# Patient Record
Sex: Male | Born: 1980 | Race: Black or African American | Hispanic: No | Marital: Single | Smoking: Current every day smoker
Health system: Southern US, Community
[De-identification: ages and names within clinical notes are randomized; demographics above are authoritative.]

## PROBLEM LIST (undated history)

## (undated) DIAGNOSIS — IMO0001 Reserved for inherently not codable concepts without codable children: Secondary | ICD-10-CM

---

## 2005-12-24 ENCOUNTER — Emergency Department (HOSPITAL_COMMUNITY): Admission: EM | Admit: 2005-12-24 | Discharge: 2005-12-24 | Payer: Self-pay | Admitting: Family Medicine

## 2006-02-19 ENCOUNTER — Emergency Department (HOSPITAL_COMMUNITY): Admission: EM | Admit: 2006-02-19 | Discharge: 2006-02-19 | Payer: Self-pay | Admitting: Emergency Medicine

## 2007-02-01 ENCOUNTER — Emergency Department (HOSPITAL_COMMUNITY): Admission: EM | Admit: 2007-02-01 | Discharge: 2007-02-02 | Payer: Self-pay | Admitting: Emergency Medicine

## 2011-05-05 ENCOUNTER — Emergency Department (HOSPITAL_COMMUNITY): Payer: Self-pay

## 2011-05-05 ENCOUNTER — Emergency Department (HOSPITAL_COMMUNITY)
Admission: EM | Admit: 2011-05-05 | Discharge: 2011-05-05 | Disposition: A | Discharge status: Home / Self Care | Payer: Self-pay | Attending: Emergency Medicine | Admitting: Emergency Medicine

## 2011-05-05 ENCOUNTER — Encounter (HOSPITAL_COMMUNITY): Payer: Self-pay | Admitting: Emergency Medicine

## 2011-05-05 DIAGNOSIS — M79609 Pain in unspecified limb: Secondary | ICD-10-CM | POA: Insufficient documentation

## 2011-05-05 DIAGNOSIS — W268XXA Contact with other sharp object(s), not elsewhere classified, initial encounter: Secondary | ICD-10-CM | POA: Insufficient documentation

## 2011-05-05 DIAGNOSIS — F17200 Nicotine dependence, unspecified, uncomplicated: Secondary | ICD-10-CM | POA: Insufficient documentation

## 2011-05-05 DIAGNOSIS — S62309A Unspecified fracture of unspecified metacarpal bone, initial encounter for closed fracture: Secondary | ICD-10-CM

## 2011-05-05 DIAGNOSIS — IMO0001 Reserved for inherently not codable concepts without codable children: Secondary | ICD-10-CM | POA: Insufficient documentation

## 2011-05-05 DIAGNOSIS — S62339A Displaced fracture of neck of unspecified metacarpal bone, initial encounter for closed fracture: Secondary | ICD-10-CM | POA: Insufficient documentation

## 2011-05-05 HISTORY — DX: Reserved for inherently not codable concepts without codable children: IMO0001

## 2011-05-05 MED ORDER — OXYCODONE-ACETAMINOPHEN 5-325 MG PO TABS
1.0000 | ORAL_TABLET | Freq: Once | ORAL | Status: AC
  Administered 2011-05-05: 1 via ORAL
  Filled 2011-05-05: qty 1

## 2011-05-05 MED ORDER — OXYCODONE-ACETAMINOPHEN 5-325 MG PO TABS: 1.0000 | ORAL_TABLET | ORAL | Status: AC | PRN

## 2011-05-05 NOTE — Progress Notes (Signed)
Orthopedic Tech Progress Note Patient Details:  George Dickerson 09/07/1980 [id number]  Type of Splint: Other (comment) (ulnae gutter) Splint Location: right hand Splint Interventions: Application    Nikki Dom 05/05/2011, 7:52 PM
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
## 2011-05-05 NOTE — Discharge Instructions (Signed)
Hand Fracture, Fifth Metacarpal  The small metacarpal is the bone at the base of the little finger between the knuckle and the wrist. A fracture is a break in that bone. One of the fractures that is common to this bone is called a Boxer's Fracture.  TREATMENT  These fractures can be treated with:    Reduction (bones moved back into place), then pinned through the skin to maintain the position, and then casted for about 6 weeks or as your caregiver determines necessary.   ORIF (open reduction and internal fixation) - the fracture site is opened and the bone pieces are fixed into place with pins and then casted for approximately 6 weeks or as your caregiver determines necessary.  Your caregiver will discuss the type of fracture you have and the treatment that should be best for that problem. If surgery is the treatment of choice, the following is information for you to know, and also let your caregiver know about prior to surgery.   LET YOUR CAREGIVER KNOW ABOUT:   Allergies.   Medications taken including herbs, eye drops, over the counter medications, and creams.   Use of steroids (by mouth or creams).   Previous problems with anesthetics or novocaine.   Possibility of pregnancy, if this applies.   History of blood clots (thrombophlebitis).   History of bleeding or blood problems.   Previous surgery.   Other health problems.  AFTER THE PROCEDURE  After surgery, you will be taken to the recovery area where a nurse will watch and check your progress. Once you're awake, stable, and taking fluids well, barring other problems you'll be allowed to go home. Once home an ice pack applied to your operative site may help with discomfort and keep the swelling down.  HOME CARE INSTRUCTIONS    Follow your caregiver's instructions as to activities, exercises, physical therapy, and driving a car.   Daily exercise is helpful for maintaining range of motion (movement and mobility) and strength. Exercise as  instructed.   To lessen swelling, keep the injured hand elevated above the level of your heart as much as possible.   Apply ice to the injury for 15 to 20 minutes each hour while awake for the first 2 days. Put the ice in a plastic bag and place a thin towel between the bag of ice and your cast.   Move the fingers of your casted hand at least several times a day.   If a plaster or fiberglass cast was applied:   Do not try to scratch the skin under the cast using a sharp or pointed object.   Check the skin around the cast every day. You may put lotion on red or sore areas.   Keep your cast dry. Your cast can be protected during bathing with a plastic bag. Do not put your cast into the water.   If a plaster splint was applied:   Wear the splint for as long as directed by your caregiver or until seen for follow-up examination.   Do not get your splint wet. Protect it during bathing with a plastic bag.   You may loosen the elastic bandage around the splint if your fingers start to get numb, tingle, get cold or turn blue.   Do not put pressure on your cast or splint; this may cause it to break. Especially, do not lean plaster casts on hard surfaces for 24 hours after application.   Take medications as directed by your caregiver.     discomfort, or fever as directed by your caregiver.   Follow all instructions for physician referrals, physical therapy, and rehabilitation. Any delay in obtaining necessary care could result in permanent injury, disability and chronic pain.  SEEK MEDICAL CARE IF:   Increased bleeding (more than a small spot) from the wound or from beneath your cast or splint if there is a wound beneath the cast from surgery.   Redness, swelling, or increasing pain in the wound or from beneath your cast or splint.   Pus coming from wound or from beneath your cast or splint.   An unexplained oral temperature  above 102 F (38.9 C) develops.   A foul smell coming from the wound or dressing or from beneath your cast or splint.   You are unable to move your little finger.  SEEK IMMEDIATE MEDICAL CARE IF:  You develop a rash, have difficulty breathing, or have any allergy problems. If you do not have a window in your cast for observing the wound, a discharge or minor bleeding may show up as a stain on the outside of your cast. Report these findings to your caregiver. MAKE SURE YOU:   Understand these instructions.   Will watch your condition.   Will get help right away if you are not doing well or get worse.  Document Released: 05/24/2000 Document Revised: 02/04/2011 Document Reviewed: 10/05/2007 [facility] Patient Information 2012 [place], [place].  Cast or Splint Care Casts and splints support injured limbs and keep bones from moving while they heal.  HOME CARE  Keep the cast or splint uncovered during the drying period.   A plaster cast can take 24 to 48 hours to dry.   A fiberglass cast will dry in less than 1 hour.   Do not rest the cast on anything harder than a pillow for 24 hours.   Do not put weight on your injured limb. Do not put pressure on the cast. Wait for your doctor's approval.   Keep the cast or splint dry.   Cover the cast or splint with a plastic bag during baths or wet weather.   If you have a cast over your chest and belly (trunk), take sponge baths until the cast is taken off.   Keep your cast or splint clean. Wash a dirty cast with a damp cloth.   Do not put any objects under your cast or splint. Do not scratch the skin under the cast with an object.   Do not take out the padding from inside your cast.   Exercise your joints near the cast as told by your doctor.   Raise (elevate) your injured limb on 1 or 2 pillows for the first 1 to 3 days.  GET HELP RIGHT AWAY IF:  Your cast or splint cracks.   Your cast or splint is too tight or too loose.   You  itch badly under the cast.   Your cast gets wet or has a soft spot.   You have a bad smell coming from the cast.   You get an object stuck under the cast.   Your skin around the cast becomes red or raw.   You have new or more pain after the cast is put on.   You have fluid leaking through the cast.   You cannot move your fingers or toes.   Your fingers or toes turn colors or are cool, painful, or puffy (swollen).   You have tingling or lose feeling (numbness) around  the injured area.   You have pain or pressure under the cast.   You have trouble breathing or have shortness of breath.   You have chest pain.  MAKE SURE YOU:  Understand these instructions.   Will watch your condition.   Will get help right away if you are not doing well or get worse.  Document Released: 06/17/2010 Document Revised: 02/04/2011 Document Reviewed: 06/17/2010 [facility] Patient Information 2012 [place], [place].
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
## 2013-05-29 ENCOUNTER — Encounter (HOSPITAL_COMMUNITY): Payer: Self-pay | Admitting: Emergency Medicine

## 2013-05-29 ENCOUNTER — Emergency Department (HOSPITAL_COMMUNITY): Payer: BC Managed Care – PPO

## 2013-05-29 ENCOUNTER — Emergency Department (HOSPITAL_COMMUNITY)
Admission: EM | Admit: 2013-05-29 | Discharge: 2013-05-30 | Disposition: A | Discharge status: Home / Self Care | Payer: BC Managed Care – PPO | Attending: Emergency Medicine | Admitting: Emergency Medicine

## 2013-05-29 DIAGNOSIS — R0789 Other chest pain: Secondary | ICD-10-CM | POA: Insufficient documentation

## 2013-05-29 DIAGNOSIS — R9431 Abnormal electrocardiogram [ECG] [EKG]: Secondary | ICD-10-CM | POA: Insufficient documentation

## 2013-05-29 DIAGNOSIS — F17200 Nicotine dependence, unspecified, uncomplicated: Secondary | ICD-10-CM | POA: Insufficient documentation

## 2013-05-29 LAB — CBC WITH DIFFERENTIAL/PLATELET
Basophils Absolute: 0 10*3/uL (ref 0.0–0.1)
Basophils Relative: 0 % (ref 0–1)
Eosinophils Absolute: 0.1 10*3/uL (ref 0.0–0.7)
Eosinophils Relative: 1 % (ref 0–5)
HCT: 48.1 % (ref 39.0–52.0)
Hemoglobin: 17 g/dL (ref 13.0–17.0)
LYMPHS ABS: 2.4 10*3/uL (ref 0.7–4.0)
Lymphocytes Relative: 36 % (ref 12–46)
MCH: 30.3 pg (ref 26.0–34.0)
MCHC: 35.3 g/dL (ref 30.0–36.0)
MCV: 85.7 fL (ref 78.0–100.0)
MONO ABS: 0.7 10*3/uL (ref 0.1–1.0)
Monocytes Relative: 11 % (ref 3–12)
NEUTROS ABS: 3.5 10*3/uL (ref 1.7–7.7)
Neutrophils Relative %: 52 % (ref 43–77)
Platelets: 265 10*3/uL (ref 150–400)
RBC: 5.61 MIL/uL (ref 4.22–5.81)
RDW: 13.8 % (ref 11.5–15.5)
WBC: 6.6 10*3/uL (ref 4.0–10.5)

## 2013-05-29 LAB — BASIC METABOLIC PANEL
BUN: 8 mg/dL (ref 6–23)
CO2: 27 meq/L (ref 19–32)
CREATININE: 1.18 mg/dL (ref 0.50–1.35)
Calcium: 9.3 mg/dL (ref 8.4–10.5)
Chloride: 100 mEq/L (ref 96–112)
GFR, EST NON AFRICAN AMERICAN: 81 mL/min — AB (ref >90)
GLUCOSE: 83 mg/dL (ref 70–99)
Potassium: 3.7 mEq/L (ref 3.7–5.3)
SODIUM: 141 meq/L (ref 137–147)

## 2013-05-29 LAB — I-STAT TROPONIN, ED: Troponin i, poc: 0 ng/mL (ref 0.00–0.08)

## 2013-05-29 MED ORDER — HYDROCODONE-ACETAMINOPHEN 5-325 MG PO TABS
1.0000 | ORAL_TABLET | Freq: Once | ORAL | Status: DC
  Filled 2013-05-29: qty 1

## 2013-05-29 MED ORDER — KETOROLAC TROMETHAMINE 60 MG/2ML IM SOLN
60.0000 mg | Freq: Once | INTRAMUSCULAR | Status: AC
  Administered 2013-05-30: 60 mg via INTRAMUSCULAR
  Filled 2013-05-29: qty 2

## 2013-05-29 NOTE — ED Provider Notes (Signed)
CSN: [id number]     Arrival date & time 05/29/13  1944 History   First MD Initiated Contact with Patient 05/29/13 2255     Chief Complaint  Patient presents with  . Chest Pain     (Consider location/radiation/quality/duration/timing/severity/associated sxs/prior Treatment) HPI Comments: 33 year old male with no medical history, no surgery history, no cardiac history, every day smoker, occasional alcohol present with chest ache since Saturday morning. Patient has constant pain sharp at times. No history of similar. Patient does lift heavy boxes regularly at work. Pain is worse with movement. No cardiac risks except for smoking.Patient denies blood clot history, active cancer, recent major trauma or surgery, unilateral leg swelling/ pain, recent long travel, hemoptysis. No exertional or diaphragmatic component. A history of stress test or family history of cardiac.   Patient is a 33 y.o. male presenting with chest pain. The history is provided by the patient.  Chest Pain Associated symptoms: no abdominal pain, no back pain, no cough, no fever, no headache, no shortness of breath and not vomiting     Past Medical History  Diagnosis Date  . No significant past medical history    History reviewed. No pertinent past surgical history. History reviewed. No pertinent family history. History  Substance Use Topics  . Smoking status: Current Every Day Smoker -- 1.00 packs/day    Types: Cigarettes  . Smokeless tobacco: Not on file  . Alcohol Use: Yes     Comment: social    Review of Systems  Constitutional: Negative for fever and chills.  HENT: Negative for congestion.   Eyes: Negative for visual disturbance.  Respiratory: Negative for cough and shortness of breath.   Cardiovascular: Positive for chest pain.  Gastrointestinal: Negative for vomiting and abdominal pain.  Genitourinary: Negative for dysuria and flank pain.  Musculoskeletal: Negative for back pain, neck pain and neck  stiffness.  Skin: Negative for rash.  Neurological: Negative for light-headedness and headaches.      Allergies  Review of patient's allergies indicates no known allergies.  Home Medications  No current outpatient prescriptions on file. BP 139/95  Pulse 80  Temp(Src) 98.8 F (37.1 C) (Oral)  Resp 18  SpO2 99% Physical Exam  Nursing note and vitals reviewed. Constitutional: He is oriented to person, place, and time. He appears well-developed and well-nourished.  HENT:  Head: Normocephalic and atraumatic.  Eyes: Conjunctivae are normal. Right eye exhibits no discharge. Left eye exhibits no discharge.  Neck: Normal range of motion. Neck supple. No tracheal deviation present.  Cardiovascular: Normal rate, regular rhythm and intact distal pulses.   Pulmonary/Chest: Effort normal and breath sounds normal.  Abdominal: Soft. He exhibits no distension. There is no tenderness. There is no guarding.  Musculoskeletal: He exhibits tenderness (mild anterior upper chest). He exhibits no edema.  Neurological: He is alert and oriented to person, place, and time.  Skin: Skin is warm. No rash noted.  Psychiatric: He has a normal mood and affect.    ED Course  Procedures (including critical care time) Labs Review Labs Reviewed  BASIC METABOLIC PANEL - Abnormal; Notable for the following:    GFR calc non Af Amer 81 (*)    All other components within normal limits  CBC WITH DIFFERENTIAL  I-STAT TROPOININ, ED  Rosezena SensorI-STAT TROPOININ, ED   Imaging Review Dg Chest 2 View  05/29/2013   CLINICAL DATA:  Chest pain, nonsmoker  EXAM: CHEST  2 VIEW  COMPARISON:  None.  FINDINGS: The heart size and mediastinal contours are  within normal limits. Both lungs are clear. The visualized skeletal structures are unremarkable.  IMPRESSION: No active cardiopulmonary disease.   Electronically Signed   By: Esperanza Heir M.D.   On: 05/29/2013 20:59     EKG Interpretation   Date/Time:  Tuesday May 29 2013  19:50:39 EDT Ventricular Rate:  98 PR Interval:  146 QRS Duration: 90 QT Interval:  328 QTC Calculation: 418 R Axis:   84 Text Interpretation:  Normal sinus rhythm Inferior lateral T wave  inversions. Confirmed by Jodi Mourning  MD, Keziah Drotar ([id number]) on 05/29/2013 10:58:30  PM      MDM   Final diagnoses:  Atypical chest pain  Abnormal EKG   Patient very low risk cardiac and very low risk pulmonary embolism. History of present illness not consistent with dissection. Pain likely musculoskeletal with age and minimal risk factors. atypical history for chest cardiac chest pain. Patient is negative for [facility] criteria for evaluation of low risk PE. Less than 77 yo, hr <100, O2 sat >94%, no hx of DVT/PE, no recent trauma/ surgery, no hemoptysis, no exogenous estrogen or unilateral leg swelling.  No further work up indicated at this time for PE as pretest probability very low.    Plan for delta troponin and 2 EKGs. EKG reviewed and it is abnormal with inferior and lateral T wave inversion, unfortunately we do not have old EKGs so plan to repeat look for changes. Pain has been constant since Saturday and if both troponins negative unlikely an acute cardiac event.  discussed patient will need close followup with cardiology for stress test and further evaluation of abnormal EKG. Discussed with cardiology on call who personally reviewed ekg and who recommended outpatient fup if troponin negative.  Patient improved on recheck.  Results and differential diagnosis were discussed with the patient. Close follow up outpatient was discussed, patient comfortable with the plan.   Filed Vitals:   05/30/13 0015 05/30/13 0030 05/30/13 0100 05/30/13 0130  BP: 125/83 118/71 130/79 125/80  Pulse:  63 72 65  Temp:      TempSrc:      Resp: 18 14 19 17   SpO2: 99% 97% 97% 97%        Enid Skeens, MD 06/01/13 2054

## 2013-05-29 NOTE — ED Notes (Signed)
Pt presents to department for evaluation of R sided non radiating chest pain. Onset Saturday. 7/10 pain upon arrival, becomes worse with movement. Pt is alert and oriented x4. Skin warm and dry. No signs of distress noted.

## 2013-05-30 ENCOUNTER — Other Ambulatory Visit: Payer: Self-pay

## 2013-05-30 LAB — I-STAT TROPONIN, ED: Troponin i, poc: 0.01 ng/mL (ref 0.00–0.08)

## 2013-05-30 MED ORDER — ASPIRIN 81 MG PO CHEW
324.0000 mg | CHEWABLE_TABLET | Freq: Once | ORAL | Status: AC
  Administered 2013-05-30: 324 mg via ORAL
  Filled 2013-05-30: qty 4

## 2013-05-30 NOTE — Discharge Instructions (Signed)
Take aspirin or ibuprofen for pain. Follow up for blood pressure monitoring and possible echo of your heart. If you were given medicines take as directed.  If you are on coumadin or contraceptives realize their levels and effectiveness is altered by many different medicines.  If you have any reaction (rash, tongues swelling, other) to the medicines stop taking and see a physician.   Please follow up as directed and return to the ER or see a physician for new or worsening symptoms.  Thank you. Filed Vitals:   05/29/13 1953 05/29/13 2258 05/29/13 2307 05/30/13 0015  BP: 145/95 139/95 139/95 125/83  Pulse: 97 80    Temp: 98.8 F (37.1 C)     TempSrc: Oral     Resp: 20  18 18   SpO2: 100%  99% 99%    Chest Pain (Nonspecific) It is often hard to give a specific diagnosis for the cause of chest pain. There is always a chance that your pain could be related to something serious, such as a heart attack or a blood clot in the lungs. You need to follow up with your caregiver for further evaluation. CAUSES   Heartburn.  Pneumonia or bronchitis.  Anxiety or stress.  Inflammation around your heart (pericarditis) or lung (pleuritis or pleurisy).  A blood clot in the lung.  A collapsed lung (pneumothorax). It can develop suddenly on its own (spontaneous pneumothorax) or from injury (trauma) to the chest.  Shingles infection (herpes zoster virus). The chest wall is composed of bones, muscles, and cartilage. Any of these can be the source of the pain.  The bones can be bruised by injury.  The muscles or cartilage can be strained by coughing or overwork.  The cartilage can be affected by inflammation and become sore (costochondritis). DIAGNOSIS  Lab tests or other studies, such as X-rays, electrocardiography, stress testing, or cardiac imaging, may be needed to find the cause of your pain.  TREATMENT   Treatment depends on what may be causing your chest pain. Treatment may include:  Acid  blockers for heartburn.  Anti-inflammatory medicine.  Pain medicine for inflammatory conditions.  Antibiotics if an infection is present.  You may be advised to change lifestyle habits. This includes stopping smoking and avoiding alcohol, caffeine, and chocolate.  You may be advised to keep your head raised (elevated) when sleeping. This reduces the chance of acid going backward from your stomach into your esophagus.  Most of the time, nonspecific chest pain will improve within 2 to 3 days with rest and mild pain medicine. HOME CARE INSTRUCTIONS   If antibiotics were prescribed, take your antibiotics as directed. Finish them even if you start to feel better.  For the next few days, avoid physical activities that bring on chest pain. Continue physical activities as directed.  Do not smoke.  Avoid drinking alcohol.  Only take over-the-counter or prescription medicine for pain, discomfort, or fever as directed by your caregiver.  Follow your caregiver's suggestions for further testing if your chest pain does not go away.  Keep any follow-up appointments you made. If you do not go to an appointment, you could develop lasting (chronic) problems with pain. If there is any problem keeping an appointment, you must call to reschedule. SEEK MEDICAL CARE IF:   You think you are having problems from the medicine you are taking. Read your medicine instructions carefully.  Your chest pain does not go away, even after treatment.  You develop a rash with blisters on your  chest. SEEK IMMEDIATE MEDICAL CARE IF:   You have increased chest pain or pain that spreads to your arm, neck, jaw, back, or abdomen.  You develop shortness of breath, an increasing cough, or you are coughing up blood.  You have severe back or abdominal pain, feel nauseous, or vomit.  You develop severe weakness, fainting, or chills.  You have a fever. THIS IS AN EMERGENCY. Do not wait to see if the pain will go away.  Get medical help at once. Call your local emergency services (911 in [place] Do not drive yourself to the hospital. MAKE SURE YOU:   Understand these instructions.  Will watch your condition.  Will get help right away if you are not doing well or get worse. Document Released: 11/25/2004 Document Revised: 05/10/2011 Document Reviewed: 09/21/2007 [facility] Patient Information 2014 [place], [place].

## 2019-02-06 ENCOUNTER — Encounter (HOSPITAL_COMMUNITY): Payer: Self-pay | Admitting: Emergency Medicine

## 2019-02-06 ENCOUNTER — Emergency Department (HOSPITAL_COMMUNITY)
Admission: EM | Admit: 2019-02-06 | Discharge: 2019-02-06 | Disposition: A | Discharge status: Home / Self Care | Payer: BC Managed Care – PPO | Attending: Emergency Medicine | Admitting: Emergency Medicine

## 2019-02-06 ENCOUNTER — Other Ambulatory Visit: Payer: Self-pay

## 2019-02-06 ENCOUNTER — Emergency Department (HOSPITAL_COMMUNITY): Payer: BC Managed Care – PPO

## 2019-02-06 DIAGNOSIS — R0789 Other chest pain: Secondary | ICD-10-CM | POA: Insufficient documentation

## 2019-02-06 DIAGNOSIS — I10 Essential (primary) hypertension: Secondary | ICD-10-CM | POA: Diagnosis not present

## 2019-02-06 DIAGNOSIS — F17210 Nicotine dependence, cigarettes, uncomplicated: Secondary | ICD-10-CM | POA: Insufficient documentation

## 2019-02-06 DIAGNOSIS — M25511 Pain in right shoulder: Secondary | ICD-10-CM | POA: Insufficient documentation

## 2019-02-06 LAB — BASIC METABOLIC PANEL
Anion gap: 9 (ref 5–15)
BUN: 10 mg/dL (ref 6–20)
CO2: 26 mmol/L (ref 22–32)
Calcium: 8.7 mg/dL — ABNORMAL LOW (ref 8.9–10.3)
Chloride: 103 mmol/L (ref 98–111)
Creatinine, Ser: 1.17 mg/dL (ref 0.61–1.24)
GFR calc Af Amer: >60 mL/min (ref >60)
GFR calc non Af Amer: >60 mL/min (ref >60)
Glucose, Bld: 97 mg/dL (ref 70–99)
Potassium: 3.8 mmol/L (ref 3.5–5.1)
Sodium: 138 mmol/L (ref 135–145)

## 2019-02-06 LAB — CBC
HCT: 48.5 % (ref 39.0–52.0)
Hemoglobin: 16.4 g/dL (ref 13.0–17.0)
MCH: 30.8 pg (ref 26.0–34.0)
MCHC: 33.8 g/dL (ref 30.0–36.0)
MCV: 91.2 fL (ref 80.0–100.0)
Platelets: 298 10*3/uL (ref 150–400)
RBC: 5.32 MIL/uL (ref 4.22–5.81)
RDW: 13.3 % (ref 11.5–15.5)
WBC: 7 10*3/uL (ref 4.0–10.5)
nRBC: 0 % (ref 0.0–0.2)

## 2019-02-06 LAB — TROPONIN I (HIGH SENSITIVITY)
Troponin I (High Sensitivity): 3 ng/L (ref <18)
Troponin I (High Sensitivity): <2 ng/L (ref <18)

## 2019-02-06 MED ORDER — IBUPROFEN 400 MG PO TABS
600.0000 mg | ORAL_TABLET | Freq: Once | ORAL | Status: AC
  Administered 2019-02-06: 600 mg via ORAL
  Filled 2019-02-06: qty 1

## 2019-02-06 NOTE — ED Provider Notes (Signed)
[facility] EMERGENCY DEPARTMENT Provider Note   CSN: [id number] Arrival date & time: 02/06/19  1751     History   Chief Complaint Chief Complaint  Patient presents with  . Chest Pain  . Back Pain    HPI George Dickerson is a 38 y.o. male.     Patient with 2 days of chest pain.  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  The history is provided by the patient.  Chest Pain Pain location:  R chest Pain quality: aching and radiating   Pain radiates to:  R shoulder Pain severity:  Mild Onset quality:  Gradual Duration:  2 days Timing:  Intermittent Chronicity:  New Context: lifting, movement and raising an arm   Context: not breathing, not drug use, not eating, not intercourse, not at rest, not stress and not trauma   Relieved by:  Rest Worsened by:  Movement Associated symptoms: back pain   Associated symptoms: no abdominal pain, no altered mental status, no anxiety, no claudication, no cough, no diaphoresis, no dizziness, no dysphagia, no heartburn, no lower extremity edema, no numbness, no PND, no shortness of breath, no vomiting and no weakness   Risk factors: hypertension, male sex and smoking   Back Pain Associated symptoms: chest pain   Associated symptoms: no abdominal pain, no numbness and no weakness     Past Medical History:  Diagnosis Date  . No significant past medical history     Patient Active Problem List   Diagnosis Date Noted  . No significant past medical history     History reviewed. No pertinent surgical history.      Home Medications    Prior to Admission medications   Medication Sig Start Date End Date Taking? Authorizing Provider  ibuprofen (ADVIL) 600 MG tablet Take 600 mg by mouth every 6 (six) hours as needed for mild pain or moderate pain.   Yes [provider]    Family History No family history on file.  Social  History Social History   Tobacco Use  . Smoking status: Current Every Day Smoker    Packs/day: 1.00    Types: Cigarettes  Substance Use Topics  . Alcohol use: Yes    Comment: social  . Drug use: No     Allergies   Patient has no known allergies.   Review of Systems Review of Systems  Constitutional: Negative for diaphoresis.  HENT: Negative for trouble swallowing.   Respiratory: Negative for cough and shortness of breath.   Cardiovascular: Positive for chest pain. Negative for claudication and PND.  Gastrointestinal: Negative for abdominal pain, heartburn and vomiting.  Musculoskeletal: Positive for back pain.  Neurological: Negative for dizziness, weakness and numbness.     Physical Exam Updated Vital Signs BP (!) 145/105   Pulse 66   Temp 98.7 F (37.1 C) (Oral)   Resp 16   Ht 5\' 8"  (1.727 m)   Wt 81.6 kg   SpO2 100%   BMI 27.37 kg/m   Physical Exam Vitals signs reviewed.  Constitutional:      General: He is not in acute distress. HENT:     Head: Normocephalic and atraumatic.  Eyes:     Extraocular Movements: Extraocular movements intact.     Pupils: Pupils are equal, round, and reactive to light.  Neck:     Musculoskeletal: Normal range of motion and neck  supple.     Vascular: No JVD.  Cardiovascular:     Rate and Rhythm: Normal rate and regular rhythm.     Heart sounds: Normal heart sounds.  Pulmonary:     Effort: Pulmonary effort is normal. No tachypnea.     Breath sounds: Normal breath sounds.  Chest:     Chest wall: Tenderness present.  Abdominal:     Palpations: Abdomen is soft.  Musculoskeletal:     Right lower leg: He exhibits no tenderness. No edema.     Left lower leg: He exhibits no tenderness. No edema.     Comments: Patient has pain when moving shoulder up and down  Lymphadenopathy:     Cervical: No cervical adenopathy.  Skin:    General: Skin is warm and dry.  Neurological:     General: No focal deficit present.     Mental  Status: He is alert and oriented to person, place, and time.  Psychiatric:        Mood and Affect: Mood normal.        Behavior: Behavior normal.      ED Treatments / Results  Labs (all labs ordered are listed, but only abnormal results are displayed) Labs Reviewed  BASIC METABOLIC PANEL - Abnormal; Notable for the following components:      Result Value   Calcium 8.7 (*)    All other components within normal limits  CBC  TROPONIN I (HIGH SENSITIVITY)  TROPONIN I (HIGH SENSITIVITY)    EKG EKG Interpretation  Date/Time:  Tuesday February 06 2019 17:58:16 EST Ventricular Rate:  73 PR Interval:  158 QRS Duration: 88 QT Interval:  374 QTC Calculation: 412 R Axis:   81 Text Interpretation: Normal sinus rhythm Normal ECG No significant change since last tracing Confirmed by Isla Pence [phone]) on 02/06/2019 8:57:34 PM   Radiology Dg Chest 2 View  Result Date: 02/06/2019 CLINICAL DATA:  Chest pain, shortness of breath. EXAM: CHEST - 2 VIEW COMPARISON:  May 29, 2013. FINDINGS: The heart size and mediastinal contours are within normal limits. Both lungs are clear. No pneumothorax or pleural effusion is noted. The visualized skeletal structures are unremarkable. IMPRESSION: No active cardiopulmonary disease. Electronically Signed   By: Marijo Conception M.D.   On: 02/06/2019 18:29    Procedures Procedures (including critical care time)  Medications Ordered in ED Medications  ibuprofen (ADVIL) tablet 600 mg (600 mg Oral Given 02/06/19 2116)     Initial Impression / Assessment and Plan / ED Course  I have reviewed the triage vital signs and the nursing notes.  Pertinent labs & imaging results that were available during my care of the patient were reviewed by me and considered in my medical decision making (see chart for details).        Patient presenting with 2 days of right-sided chest pain that radiates to the shoulder.  Most likely MSK given overuse from his job.   Work-up thus far has been relatively negative with EKG interest, negative troponin x1 chest x-ray negative, BMP and CBC unremarkable.    Patient presents with hypertension. Recommend follow up with PCP for further management.   Final Clinical Impressions(s) / ED Diagnoses   Final diagnoses:  Chest wall pain  Hypertension, unspecified type    ED Discharge Orders    None       Bonnita Hollow, MD 02/06/19 2409    Isla Pence, MD 02/06/19 2225

## 2019-02-06 NOTE — ED Triage Notes (Signed)
Pt reports right sided chest pain and back/shoulderblade pain since yesterday. Endorses some intermittent SOB.

## 2019-02-06 NOTE — Discharge Instructions (Addendum)
Please follow up with your regular doctor for hypertension (aka High Blood pressure). Please come back to the ED if you develop any worsening chest pain that does not improve with medication, nausea, abdominal pain, Shortness of Breath or other worrisome symptom.

## 2019-05-29 ENCOUNTER — Ambulatory Visit (HOSPITAL_COMMUNITY)
Admission: EM | Admit: 2019-05-29 | Discharge: 2019-05-29 | Disposition: A | Discharge status: Home / Self Care | Payer: BC Managed Care – PPO | Attending: Family | Admitting: Family

## 2019-05-29 ENCOUNTER — Ambulatory Visit (INDEPENDENT_AMBULATORY_CARE_PROVIDER_SITE_OTHER): Payer: BC Managed Care – PPO

## 2019-05-29 ENCOUNTER — Encounter (HOSPITAL_COMMUNITY): Payer: Self-pay | Admitting: Emergency Medicine

## 2019-05-29 ENCOUNTER — Other Ambulatory Visit: Payer: Self-pay

## 2019-05-29 DIAGNOSIS — R079 Chest pain, unspecified: Secondary | ICD-10-CM | POA: Diagnosis not present

## 2019-05-29 DIAGNOSIS — Y92009 Unspecified place in unspecified non-institutional (private) residence as the place of occurrence of the external cause: Secondary | ICD-10-CM

## 2019-05-29 DIAGNOSIS — R0781 Pleurodynia: Secondary | ICD-10-CM | POA: Diagnosis not present

## 2019-05-29 DIAGNOSIS — R030 Elevated blood-pressure reading, without diagnosis of hypertension: Secondary | ICD-10-CM

## 2019-05-29 DIAGNOSIS — W19XXXA Unspecified fall, initial encounter: Secondary | ICD-10-CM | POA: Diagnosis not present

## 2019-05-29 MED ORDER — NAPROXEN 500 MG PO TABS: 500.0000 mg | ORAL_TABLET | Freq: Two times a day (BID) | ORAL | 0 refills | Status: DC | PRN

## 2019-05-29 NOTE — ED Triage Notes (Signed)
Pt here for fall on Saturday night landing on left side of his ribs on steps; bruising noted to area

## 2019-05-29 NOTE — Discharge Instructions (Addendum)
Recommend take Naproxen 500mg  every 12 hours as needed for pain. Apply heat to area for comfort- may alternate with ice as needed. Avoid strenuous lifting. Continue to monitor symptoms. If any increase in pain, difficulty breathing or shortness of breath occur, return for follow-up or go to the ER ASAP.

## 2019-05-29 NOTE — ED Provider Notes (Signed)
[facility]    CSN: [id number] Arrival date & time: 05/29/19  1603      History   Chief Complaint Chief Complaint  Patient presents with  . Fall    HPI George Dickerson is a 39 y.o. male.   39 year old male presents with injury to the left side of his posterior chest. He slipped off steps at home 4 days ago (was cooking and worried about burning food) and fell, landing on his left side of his chest on wooden steps. He felt some pain at the time but it worsened the next day and thought it would improve with time. The pain continues to get worse, especially with deep breathing and certain movements. Concerned over fracture of ribs. Some bruising noted by family members on left posterior area of back. Has applied warm heat with minimal relief. Has not taken any medication yet for pain. No previous history of injury to chest or back. Works for [employer] and does a lot of lifting. No chronic health issues. Takes no daily medication.   The history is provided by the patient.    Past Medical History:  Diagnosis Date  . No significant past medical history     Patient Active Problem List   Diagnosis Date Noted  . No significant past medical history     History reviewed. No pertinent surgical history.     Home Medications    Prior to Admission medications   Medication Sig Start Date End Date Taking? Authorizing Provider  naproxen (NAPROSYN) 500 MG tablet Take 1 tablet (500 mg total) by mouth every 12 (twelve) hours as needed for moderate pain. 05/29/19   Sudie Grumbling, NP    Family History History reviewed. No pertinent family history.  Social History Social History   Tobacco Use  . Smoking status: Current Every Day Smoker    Packs/day: 1.00    Types: Cigarettes  Substance Use Topics  . Alcohol use: Yes    Comment: social  . Drug use: No     Allergies   Patient has no known allergies.   Review of Systems Review of Systems  Constitutional: Negative for  activity change, appetite change, chills, fatigue and fever.  Respiratory: Negative for cough, chest tightness, shortness of breath and wheezing.   Cardiovascular: Positive for chest pain (posterior left side). Negative for palpitations.  Gastrointestinal: Negative for nausea and vomiting.  Musculoskeletal: Positive for back pain (left side thoracic ) and myalgias. Negative for gait problem, neck pain and neck stiffness.  Skin: Positive for color change. Negative for rash and wound.  Allergic/Immunologic: Negative for environmental allergies, food allergies and immunocompromised state.  Neurological: Negative for dizziness, tremors, seizures, syncope, weakness, light-headedness, numbness and headaches.  Hematological: Negative for adenopathy. Does not bruise/bleed easily.     Physical Exam Triage Vital Signs ED Triage Vitals [05/29/19 1642]  Enc Vitals Group     BP (!) 167/95     Pulse Rate 75     Resp 18     Temp 98.4 F (36.9 C)     Temp Source Oral     SpO2 96 %     Weight      Height      Head Circumference      Peak Flow      Pain Score 8     Pain Loc      Pain Edu?      Excl. in GC?    No data found.  Updated Vital Signs BP (!) 167/95 (BP Location: Right Arm)   Pulse 75   Temp 98.4 F (36.9 C) (Oral)   Resp 18   SpO2 96%   Visual Acuity Right Eye Distance:   Left Eye Distance:   Bilateral Distance:    Right Eye Near:   Left Eye Near:    Bilateral Near:     Physical Exam Vitals and nursing note reviewed.  Constitutional:      General: He is awake. He is not in acute distress.    Appearance: He is well-developed and well-groomed. He is not ill-appearing.     Comments: Patient is sitting comfortably on exam table in no acute distress but appears in pain. Most comfortable position is partial flexion of back.   HENT:     Head: Normocephalic and atraumatic.  Eyes:     Extraocular Movements: Extraocular movements intact.     Conjunctiva/sclera: Conjunctivae  normal.  Cardiovascular:     Rate and Rhythm: Normal rate and regular rhythm.     Heart sounds: Normal heart sounds. No murmur.  Pulmonary:     Effort: Pulmonary effort is normal. No accessory muscle usage, respiratory distress or retractions.     Breath sounds: Normal breath sounds and air entry. No stridor or decreased air movement. No decreased breath sounds, wheezing, rhonchi or rales.       Comments: Slight swelling and mild bruising present on left posterior area of lower thoracic region around 9th/10th ribs. Very tender. Has full range of motion of back but increased pain, especially with extension and rotation. No neuro deficits noted.  Chest:     Chest wall: Swelling and tenderness present. No lacerations or deformity.  Musculoskeletal:        General: Tenderness present.     Cervical back: Normal range of motion.  Skin:    General: Skin is warm and dry.     Capillary Refill: Capillary refill takes less than 2 seconds.     Findings: Bruising present. No abrasion, erythema, lesion, rash or wound.     Comments: Slight bruising present left posterior lower thoracic region of back.   Neurological:     General: No focal deficit present.     Mental Status: He is alert and oriented to person, place, and time.     Sensory: Sensation is intact. No sensory deficit.     Motor: Motor function is intact.     Gait: Gait is intact.  Psychiatric:        Mood and Affect: Mood normal.        Behavior: Behavior normal. Behavior is cooperative.        Thought Content: Thought content normal.        Judgment: Judgment normal.      UC Treatments / Results  Labs (all labs ordered are listed, but only abnormal results are displayed) Labs Reviewed - No data to display  EKG   Radiology DG Ribs Unilateral W/Chest Left  Result Date: 05/29/2019 CLINICAL DATA:  Fall with worsening pain EXAM: LEFT RIBS AND CHEST - 3+ VIEW COMPARISON:  02/06/2019 FINDINGS: Single-view chest demonstrates no  focal opacity or pleural effusion. Normal heart size. No pneumothorax. Left rib series demonstrates no acute displaced left rib fracture IMPRESSION: Negative. Electronically Signed   By: Donavan Foil M.D.   On: 05/29/2019 17:34    Procedures Procedures (including critical care time)  Medications Ordered in UC Medications - No data to display  Initial Impression / Assessment  and Plan / UC Course  I have reviewed the triage vital signs and the nursing notes.  Pertinent labs & imaging results that were available during my care of the patient were reviewed by me and considered in my medical decision making (see chart for details).    Reviewed x-ray results with patient- no rib fracture or pneumothorax or pleural effusion. Discussed that he probably bruised his ribs and the ligaments/muscles around his ribs. Recommend trial Naproxen 500mg  twice a day as needed for pain. Apply warm heat to area for comfort- may alternate with ice as needed. Avoid strenuous movements or lifting. Continue to monitor symptoms. Blood pressure may be elevated due to pain- encouraged to continue to monitor. Note written for work. Follow-up here if any increase in pain, difficulty breathing or shortness of breath occur or go to the ER ASAP if symptoms worsen.  Final Clinical Impressions(s) / UC Diagnoses   Final diagnoses:  Rib pain on left side  Fall in home, initial encounter  Elevated blood pressure reading in office without diagnosis of hypertension     Discharge Instructions     Recommend take Naproxen 500mg  every 12 hours as needed for pain. Apply heat to area for comfort- may alternate with ice as needed. Avoid strenuous lifting. Continue to monitor symptoms. If any increase in pain, difficulty breathing or shortness of breath occur, return for follow-up or go to the ER ASAP.     ED Prescriptions    Medication Sig Dispense Auth. Provider   naproxen (NAPROSYN) 500 MG tablet Take 1 tablet (500 mg total) by  mouth every 12 (twelve) hours as needed for moderate pain. 20 tablet Catalyna Reilly, , NP     PDMP not reviewed this encounter.   , NP 05/29/19 2306

## 2020-03-29 ENCOUNTER — Other Ambulatory Visit: Payer: Self-pay

## 2020-03-29 ENCOUNTER — Emergency Department (HOSPITAL_COMMUNITY)
Admission: EM | Admit: 2020-03-29 | Discharge: 2020-03-30 | Disposition: A | Discharge status: Home / Self Care | Payer: BC Managed Care – PPO | Attending: Emergency Medicine | Admitting: Emergency Medicine

## 2020-03-29 DIAGNOSIS — Z5321 Procedure and treatment not carried out due to patient leaving prior to being seen by health care provider: Secondary | ICD-10-CM | POA: Diagnosis not present

## 2020-03-29 DIAGNOSIS — R519 Headache, unspecified: Secondary | ICD-10-CM | POA: Insufficient documentation

## 2020-03-29 MED ORDER — IBUPROFEN 400 MG PO TABS
400.0000 mg | ORAL_TABLET | Freq: Once | ORAL | Status: AC | PRN
  Administered 2020-03-29: 400 mg via ORAL
  Filled 2020-03-29: qty 1

## 2020-03-29 NOTE — ED Triage Notes (Signed)
Pt presents to ED POV. Pt c/o HA that began earlier today. Pt did not take any OTC meds. AAO x4

## 2020-03-30 ENCOUNTER — Emergency Department (HOSPITAL_COMMUNITY): Payer: BC Managed Care – PPO

## 2020-03-30 NOTE — ED Notes (Signed)
Pt called X3 for vitals. Pt removed from waiting room list.

## 2020-03-31 ENCOUNTER — Encounter (HOSPITAL_COMMUNITY): Payer: Self-pay

## 2020-03-31 ENCOUNTER — Ambulatory Visit (HOSPITAL_COMMUNITY)
Admission: EM | Admit: 2020-03-31 | Discharge: 2020-03-31 | Disposition: A | Discharge status: Home / Self Care | Payer: BC Managed Care – PPO | Attending: Urgent Care | Admitting: Urgent Care

## 2020-03-31 ENCOUNTER — Other Ambulatory Visit: Payer: Self-pay

## 2020-03-31 DIAGNOSIS — I10 Essential (primary) hypertension: Secondary | ICD-10-CM | POA: Insufficient documentation

## 2020-03-31 DIAGNOSIS — U071 COVID-19: Secondary | ICD-10-CM | POA: Diagnosis not present

## 2020-03-31 DIAGNOSIS — R52 Pain, unspecified: Secondary | ICD-10-CM | POA: Insufficient documentation

## 2020-03-31 DIAGNOSIS — B349 Viral infection, unspecified: Secondary | ICD-10-CM | POA: Diagnosis not present

## 2020-03-31 DIAGNOSIS — Z20822 Contact with and (suspected) exposure to covid-19: Secondary | ICD-10-CM | POA: Diagnosis present

## 2020-03-31 DIAGNOSIS — R509 Fever, unspecified: Secondary | ICD-10-CM | POA: Diagnosis present

## 2020-03-31 MED ORDER — BENZONATATE 100 MG PO CAPS: 100.0000 mg | ORAL_CAPSULE | Freq: Three times a day (TID) | ORAL | 0 refills | Status: AC | PRN

## 2020-03-31 MED ORDER — CETIRIZINE HCL 10 MG PO TABS: 10.0000 mg | ORAL_TABLET | Freq: Every day | ORAL | 0 refills | Status: AC

## 2020-03-31 MED ORDER — PROMETHAZINE-DM 6.25-15 MG/5ML PO SYRP: 5.0000 mL | ORAL_SOLUTION | Freq: Every evening | ORAL | 0 refills | Status: DC | PRN

## 2020-03-31 NOTE — ED Triage Notes (Signed)
Pt in with c/o body aches, headaches and subjective fever that has been going on for 3 days now  Pt took ibuprofen with some relief

## 2020-03-31 NOTE — Discharge Instructions (Addendum)
We will notify you of your COVID-19 test results as they arrive and may take between 24 to 48 hours.  I encourage you to sign up for MyChart if you have not already done so as this can be the easiest way for us to communicate results to you online or through a phone app.  In the meantime, if you develop worsening symptoms including fever, chest pain, shortness of breath despite our current treatment plan then please report to the emergency room as this may be a sign of worsening status from possible COVID-19 infection. ° °Otherwise, we will manage this as a viral syndrome. For sore throat or cough try using a honey-based tea. Use 3 teaspoons of honey with juice squeezed from half lemon. Place shaved pieces of ginger into 1/2-1 cup of water and warm over stove top. Then mix the ingredients and repeat every 4 hours as needed. Please take Tylenol 500mg-650mg every 6 hours for aches and pains, fevers. Hydrate very well with at least 2 liters of water. Eat light meals such as soups to replenish electrolytes and soft fruits, veggies. Start an antihistamine like Zyrtec, Allegra or Claritin for postnasal drainage, sinus congestion.  ° ° °For diabetes or elevated blood sugar, please make sure you are limiting and avoiding starchy, carbohydrate foods like pasta, breads, sweet breads, pastry, rice, potatoes, desserts. These foods can elevate your blood sugar. Also, limit and avoid drinks that contain a lot of sugar such as sodas, sweet teas, fruit juices.  Drinking plain water will be much more helpful, try 64 ounces of water daily.  It is okay to flavor your water naturally by cutting cucumber, lemon, mint or lime, placing it in a picture with water and drinking it over a period of 24-48 hours as long as it remains refrigerated. ° °For elevated blood pressure, make sure you are monitoring salt in your diet.  Do not eat restaurant foods and limit processed foods at home. I highly recommend you prepare and cook your own foods  at home.  Processed foods include things like frozen meals, pre-seasoned meats and dinners, deli meats, canned foods as these foods contain a high amount of sodium/salt.  Make sure you are paying attention to sodium labels on foods you buy at the grocery store. Buy your spices separately such as garlic powder, onion powder, cumin, cayenne, parsley flakes so that you can avoid seasonings that contain salt. However, salt-free seasonings are available and can be used, an example is Mrs. Dash and includes a lot of different mixtures that do not contain salt. ° °Lastly, when cooking using oils that are healthier for you is important. This includes olive oil, avocado oil, canola oil. We have discussed a lot of foods to avoid but below is a list of foods that can be very healthy to use in your diet whether it is for diabetes, cholesterol, high blood pressure, or in general healthy eating. ° °Salads - kale, spinach, cabbage, spring mix, arugula °Fruits - avocadoes, berries (blueberries, raspberries, blackberries), apples, oranges, pomegranate, grapefruit, kiwi °Vegetables - asparagus, cauliflower, broccoli, green beans, brussel sprouts, bell peppers, beets; stay away from or limit starchy vegetables like potatoes, carrots, peas °Other general foods - kidney beans, egg whites, almonds, walnuts, sunflower seeds, pumpkin seeds, fat free yogurt, almond milk, flax seeds, quinoa, oats  °Meat - It is better to eat lean meats and limit your red meat including pork to once a week.  Wild caught fish, chicken breast are good options as they   tend to be leaner sources of good protein. Still be mindful of the sodium labels for the meats you buy. ° °DO NOT EAT ANY FOODS ON THIS LIST THAT YOU ARE ALLERGIC TO. For more specific needs, I highly recommend consulting a dietician or nutritionist but this can definitely be a good starting point. ° °

## 2020-03-31 NOTE — ED Provider Notes (Signed)
[facility]   MRN: [id number] DOB: 05-Nov-1980  Subjective:   George Dickerson is a 40 y.o. male presenting for 3-day history of persistent body aches, headaches and fever.  Patient had very close exposure to COVID-19, his daughter tested positive and he had to take care of her.  He is not vaccinated.  Denies chest pain, shortness of breath, breathing disorders.    No current facility-administered medications for this encounter.  Current Outpatient Medications:  .  naproxen (NAPROSYN) 500 MG tablet, Take 1 tablet (500 mg total) by mouth every 12 (twelve) hours as needed for moderate pain., Disp: 20 tablet, Rfl: 0   No Known Allergies  Past Medical History:  Diagnosis Date  . No significant past medical history      History reviewed. No pertinent surgical history.  History reviewed. No pertinent family history.  Social History   Tobacco Use  . Smoking status: Current Every Day Smoker    Packs/day: 1.00    Types: Cigarettes  . Smokeless tobacco: Never Used  Substance Use Topics  . Alcohol use: Yes    Comment: social  . Drug use: No    ROS   Objective:   Vitals: BP (!) 154/106   Pulse 68   Temp 98.2 F (36.8 C)   Resp 17   SpO2 95%   Wt Readings from Last 3 Encounters:  02/06/19 180 lb (81.6 kg)   Temp Readings from Last 3 Encounters:  03/31/20 98.2 F (36.8 C)  03/29/20 98.4 F (36.9 C) (Oral)  05/29/19 98.4 F (36.9 C) (Oral)   BP Readings from Last 3 Encounters:  03/31/20 (!) 154/106  03/30/20 (!) 142/102  05/29/19 (!) 167/95   Pulse Readings from Last 3 Encounters:  03/31/20 68  03/30/20 61  05/29/19 75   Physical Exam Constitutional:      General: He is not in acute distress.    Appearance: Normal appearance. He is well-developed. He is not ill-appearing, toxic-appearing or diaphoretic.  HENT:     Head: Normocephalic and atraumatic.     Right Ear: External ear normal.     Left Ear: External ear normal.     Nose: Nose  normal.     Mouth/Throat:     Mouth: Mucous membranes are moist.     Pharynx: Oropharynx is clear.  Eyes:     General: No scleral icterus.    Extraocular Movements: Extraocular movements intact.     Pupils: Pupils are equal, round, and reactive to light.  Cardiovascular:     Rate and Rhythm: Normal rate and regular rhythm.     Heart sounds: Normal heart sounds. No murmur heard. No friction rub. No gallop.   Pulmonary:     Effort: Pulmonary effort is normal. No respiratory distress.     Breath sounds: Normal breath sounds. No stridor. No wheezing, rhonchi or rales.  Neurological:     Mental Status: He is alert and oriented to person, place, and time.     Cranial Nerves: No cranial nerve deficit.     Motor: No weakness.     Coordination: Romberg sign negative. Coordination normal.     Gait: Gait normal.  Psychiatric:        Mood and Affect: Mood normal.        Behavior: Behavior normal.        Thought Content: Thought content normal.      Assessment and Plan :   PDMP not reviewed this encounter.  1.  Viral syndrome   2. Close exposure to COVID-19 virus   3. Fever, unspecified   4. Body aches   5. Essential hypertension     Will manage for viral illness such as viral URI, viral syndrome, viral rhinitis, COVID-19. Counseled patient on nature of COVID-19 including modes of transmission, diagnostic testing, management and supportive care.  Offered scripts for symptomatic relief. COVID 19 testing is pending. Discussed needing treatment for HTN but he does not want this now. Will use dietary modifications. Counseled patient on potential for adverse effects with medications prescribed/recommended today, ER and return-to-clinic precautions discussed, patient verbalized understanding.     Wallis Bamberg, PA-C 03/31/20 2013

## 2020-04-01 LAB — SARS CORONAVIRUS 2 (TAT 6-24 HRS): SARS Coronavirus 2: POSITIVE — AB

## 2020-04-02 ENCOUNTER — Telehealth: Payer: Self-pay | Admitting: Family

## 2020-04-02 NOTE — Telephone Encounter (Signed)
Called to discuss with patient about COVID-19 symptoms and the use of one of the available treatments for those with mild to moderate Covid symptoms and at a high risk of hospitalization.  Pt appears to qualify for outpatient treatment due to co-morbid conditions and/or a member of an at-risk group in accordance with the FDA Emergency Use Authorization.    Symptom onset: 03/28/20 Vaccinated: No Booster? No Immunocompromised? No Qualifiers: ethnicity  Discussed via phone. Tells me headache has not recurred and his energy level is improving. He is requiring no OTC medications. Endorses staying well hydrated though only eating one meal per day and tells me he feels as if his appetite is returning today. Politely declines additional COVID19 treatment as symptoms are improving and I agree with this decision. Denies cough, shortness of breath, respiratory symptoms.   George Dickerson

## 2020-05-02 ENCOUNTER — Other Ambulatory Visit: Payer: Self-pay

## 2020-05-02 ENCOUNTER — Ambulatory Visit (HOSPITAL_COMMUNITY)
Admission: EM | Admit: 2020-05-02 | Discharge: 2020-05-02 | Disposition: A | Discharge status: Home / Self Care | Payer: BC Managed Care – PPO | Attending: Internal Medicine | Admitting: Internal Medicine

## 2020-05-02 ENCOUNTER — Encounter (HOSPITAL_COMMUNITY): Payer: Self-pay

## 2020-05-02 DIAGNOSIS — M545 Low back pain, unspecified: Secondary | ICD-10-CM

## 2020-05-02 MED ORDER — TIZANIDINE HCL 4 MG PO TABS: 4.0000 mg | ORAL_TABLET | Freq: Every evening | ORAL | 0 refills | Status: AC | PRN

## 2020-05-02 MED ORDER — IBUPROFEN 600 MG PO TABS: 600.0000 mg | ORAL_TABLET | Freq: Four times a day (QID) | ORAL | 0 refills | Status: AC | PRN
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
## 2020-05-02 NOTE — Discharge Instructions (Addendum)
Gentle range of motion exercises Icing of the lower back Warm shower may help with muscle stiffness and spasms Take medications as directed Return to urgent care if symptoms worsen.
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
## 2020-12-06 IMAGING — CR DG CHEST 2V
2 series · 2 of 2 positions shown · non-contrast
Comparison: May 29, 2013.

CLINICAL DATA: Chest pain, shortness of breath.

EXAM:
CHEST - 2 VIEW

[chest pa]
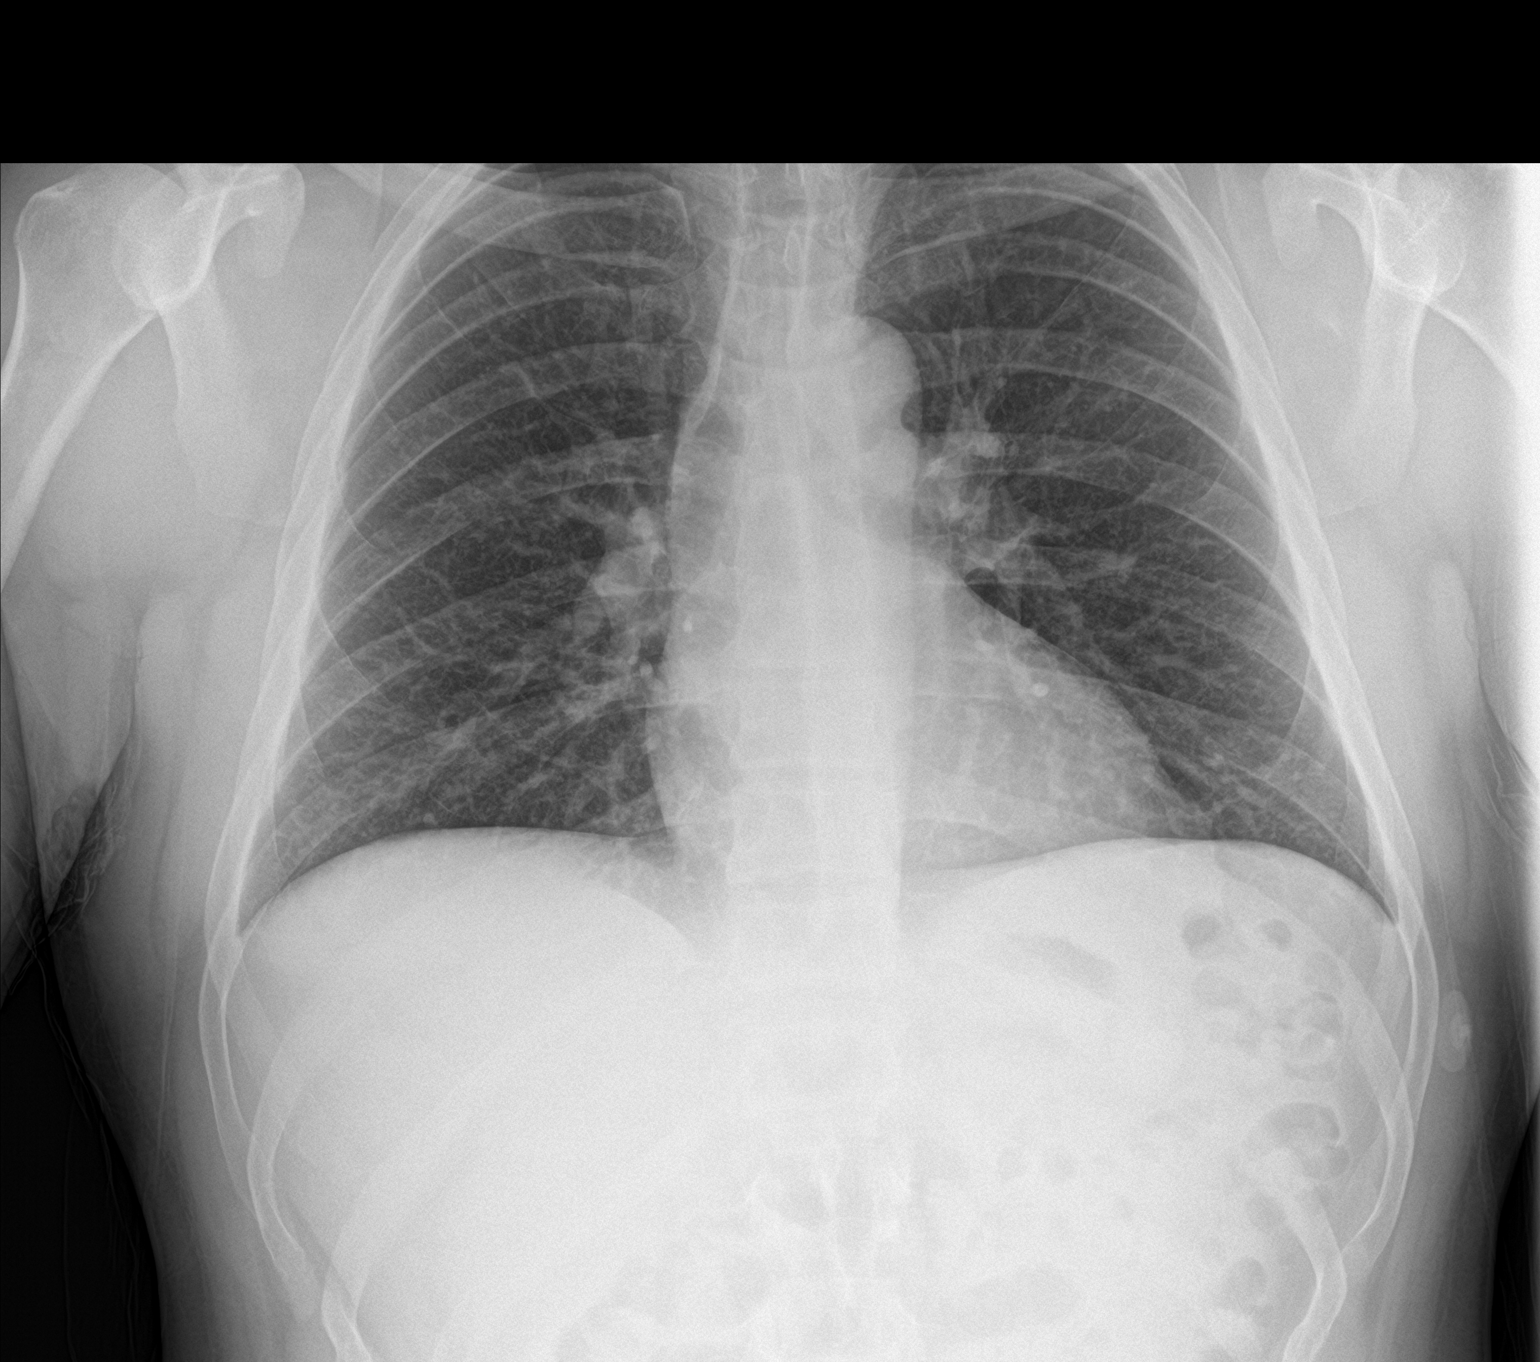

[chest lat]
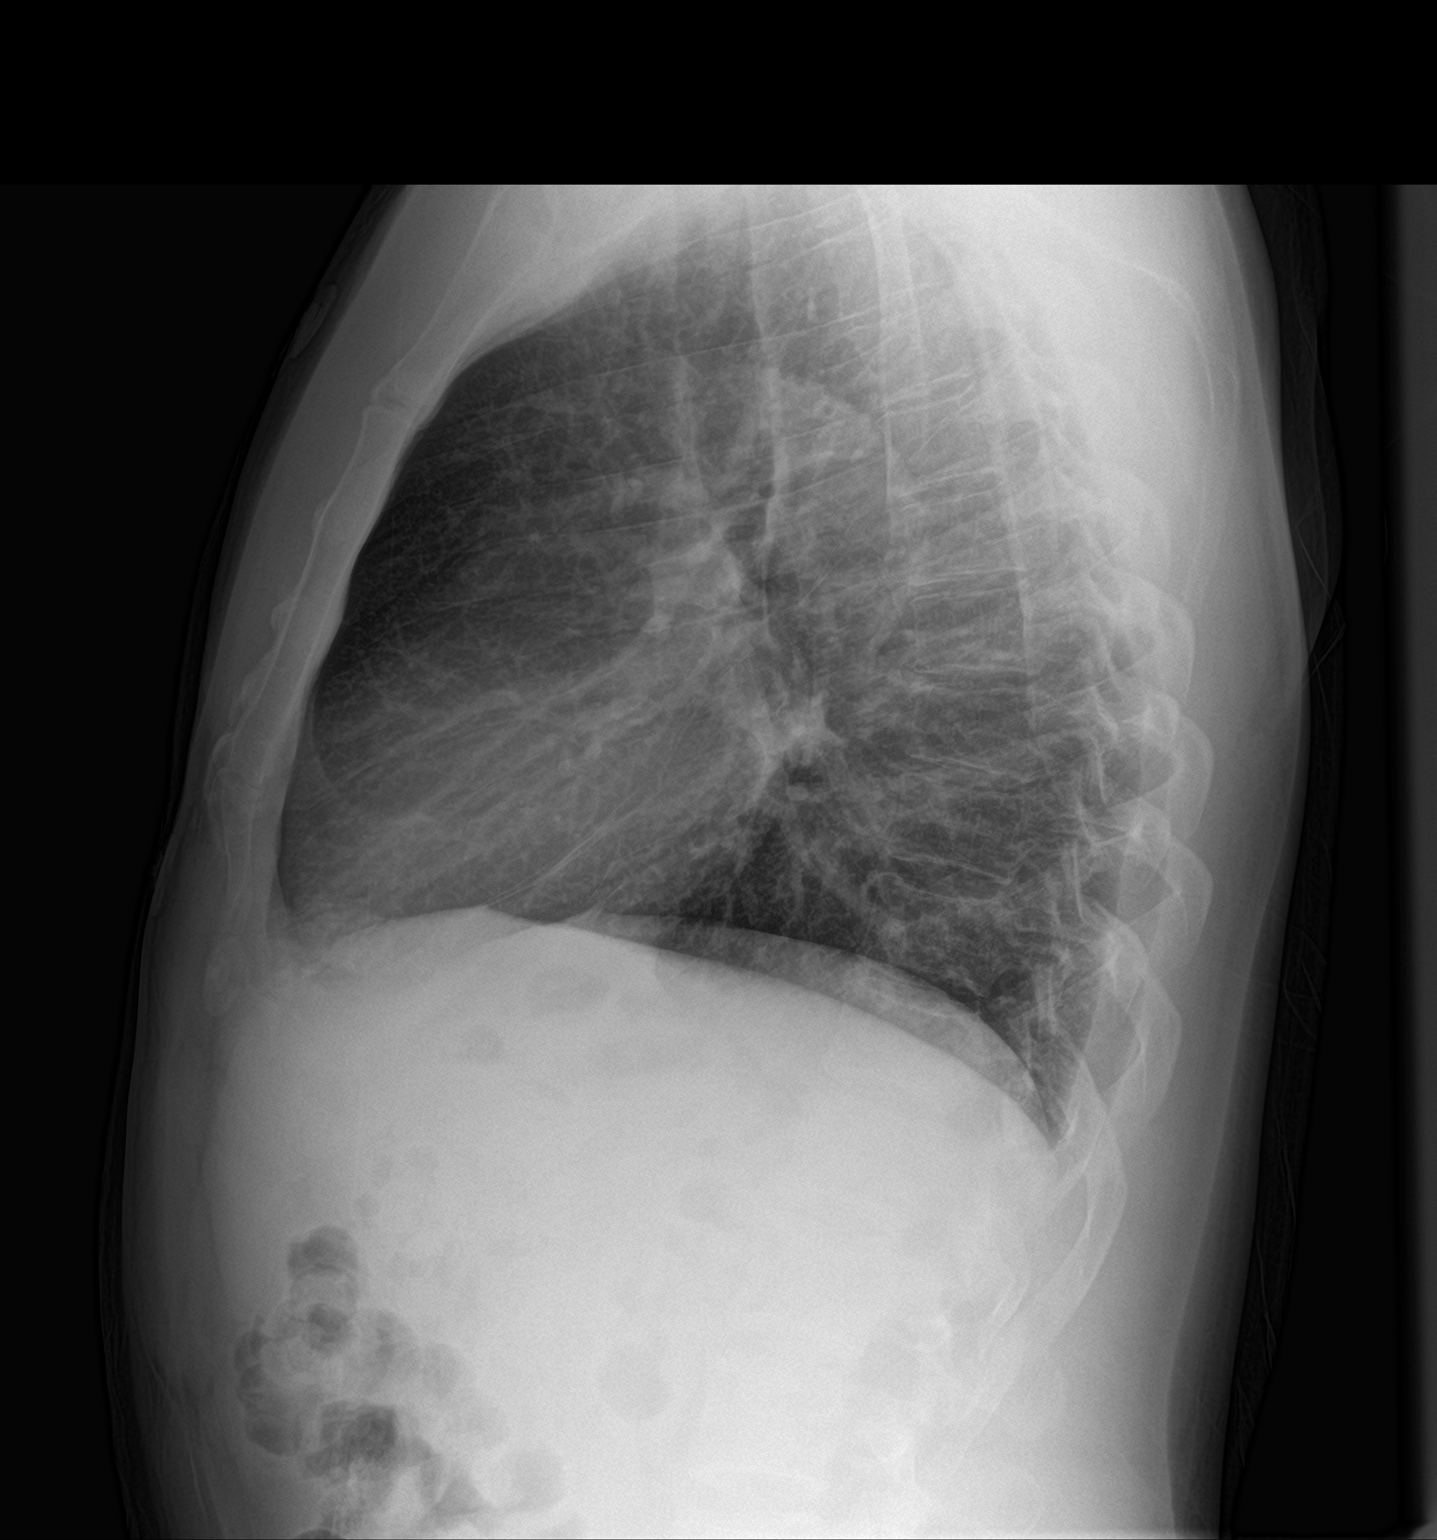

[2 of 2 positions shown; findings below may reference images not displayed]

FINDINGS: The heart size and mediastinal contours are within normal limits.
Both lungs are clear. No pneumothorax or pleural effusion is noted.
The visualized skeletal structures are unremarkable.
IMPRESSION: No active cardiopulmonary disease.

## 2022-01-28 IMAGING — CT CT HEAD W/O CM
3 of 4 series · 14 of 47 positions shown, 16 images · non-contrast
Comparison: None.

CLINICAL DATA: Headache, classic migraine.

EXAM:
CT HEAD WITHOUT CONTRAST
TECHNIQUE: Contiguous axial images were obtained from the base of the skull
through the vertex without intravenous contrast.

[Series 4: head 2.0 h70h · axial · 0.45mm/px · z∈[-94,+36]mm · 8 of 83 slices shown, 10 images]
[im 9/83  brain]
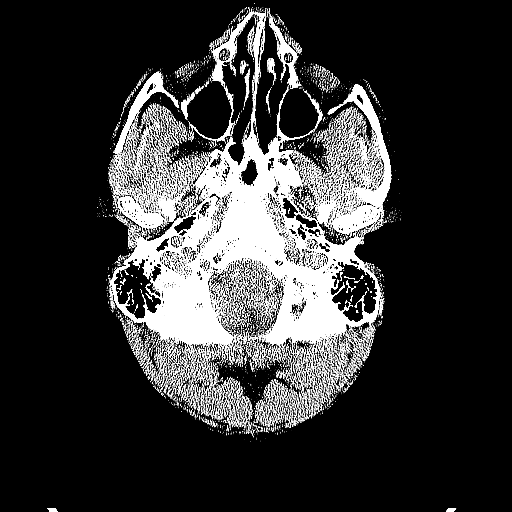
[im 9/83  bone]
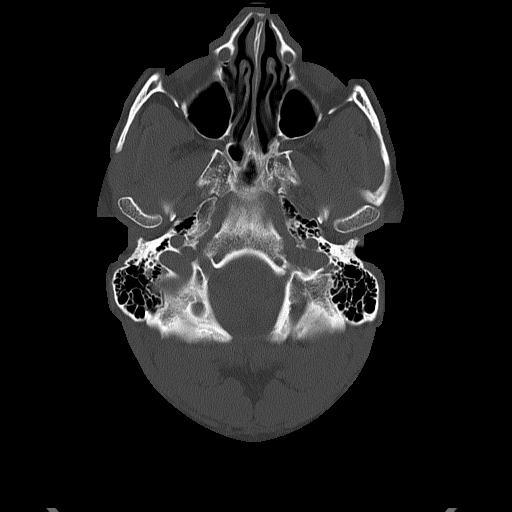
[im 17/83  brain]
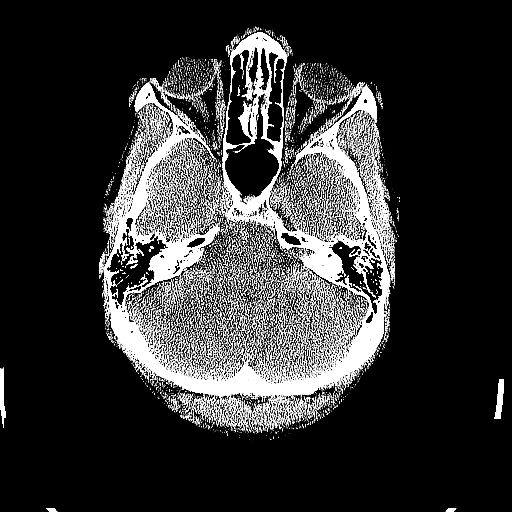
[im 25/83  brain]
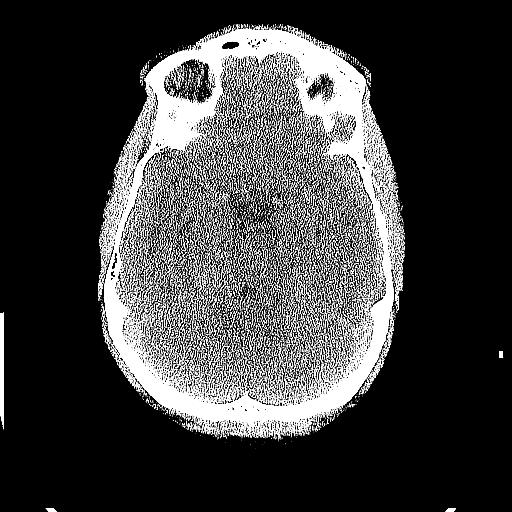
[im 37/83  brain]
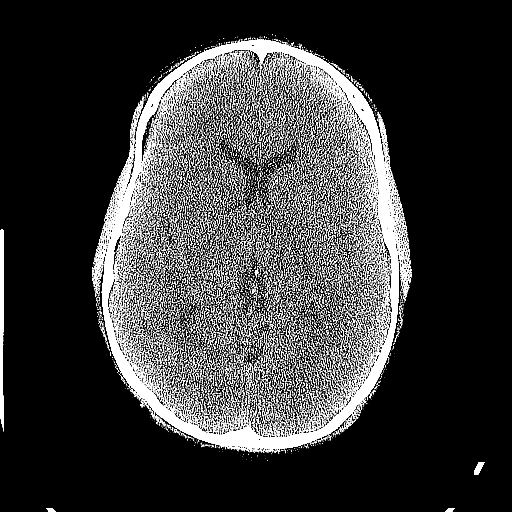
[im 46/83  brain]
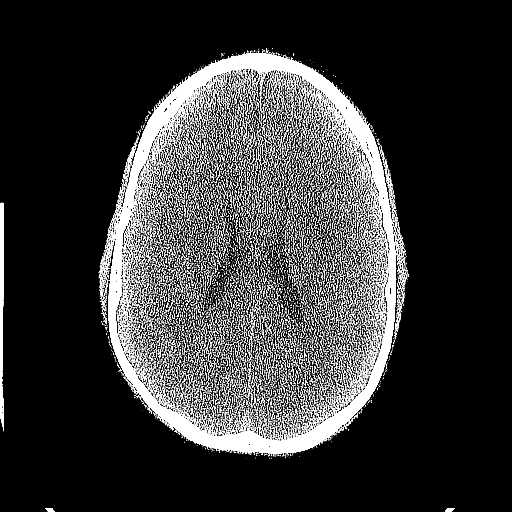
[im 46/83  bone]
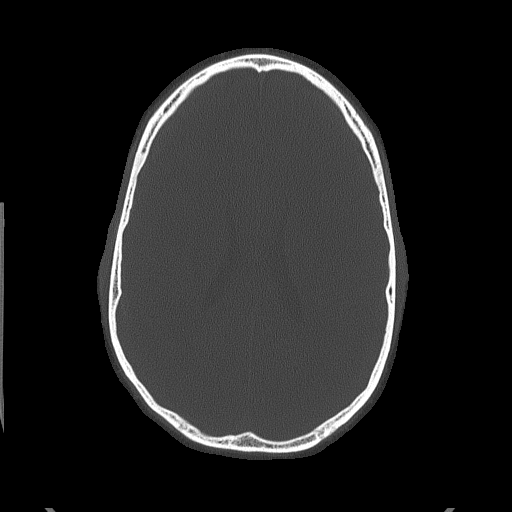
[im 58/83  brain]
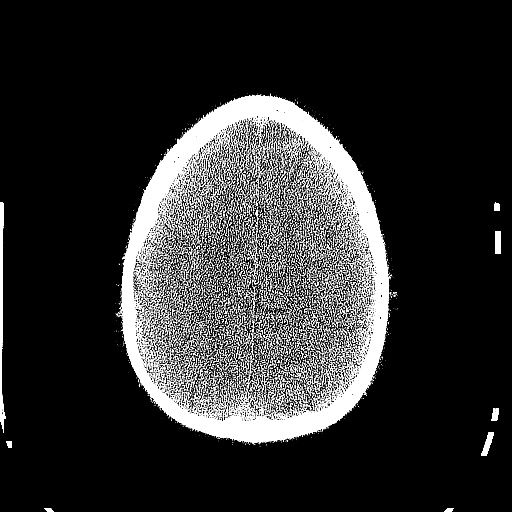
[im 66/83  brain]
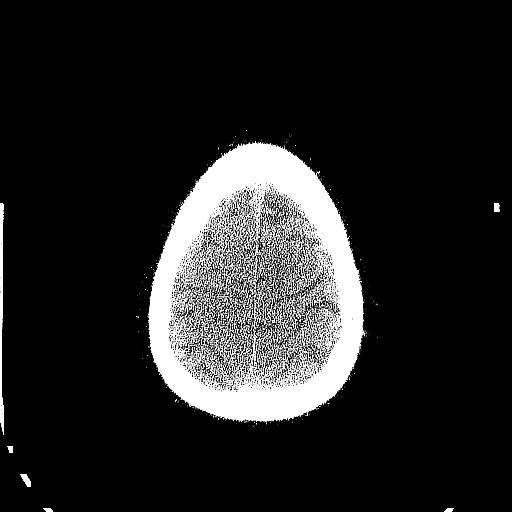
[im 74/83  brain]
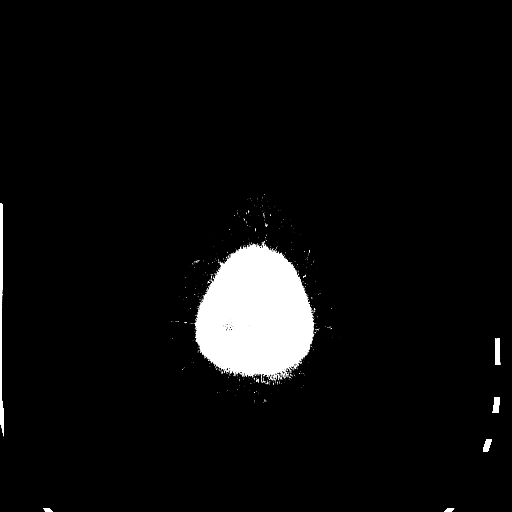

[Series 5: head 3.0 mpr cor · coronal · 0.33mm/px · 3 of 82 slices shown]
[im 28/82  brain]
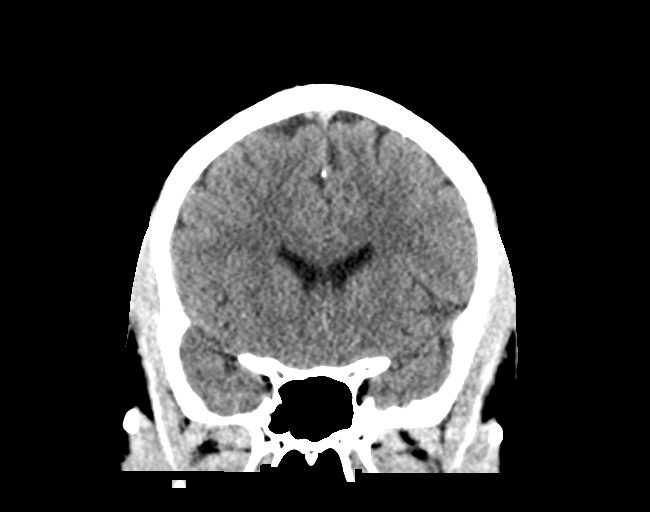
[im 37/82  brain]
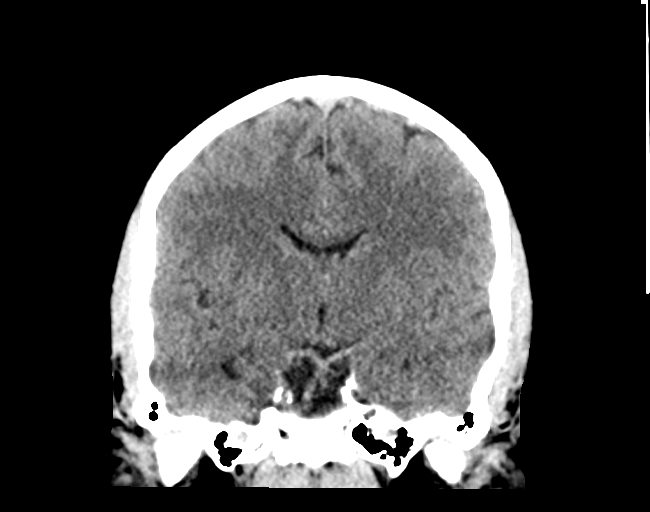
[im 46/82  brain]
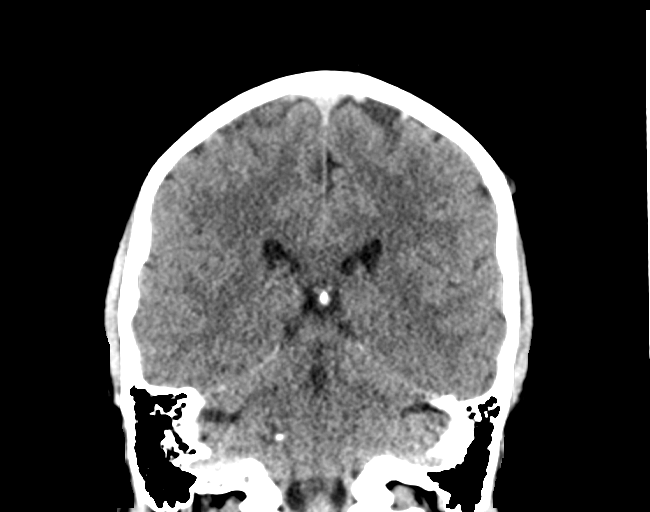

[Series 6: head 3.0 mpr sag · sagittal · 0.33mm/px · 3 of 67 slices shown]
[im 23/67  brain]
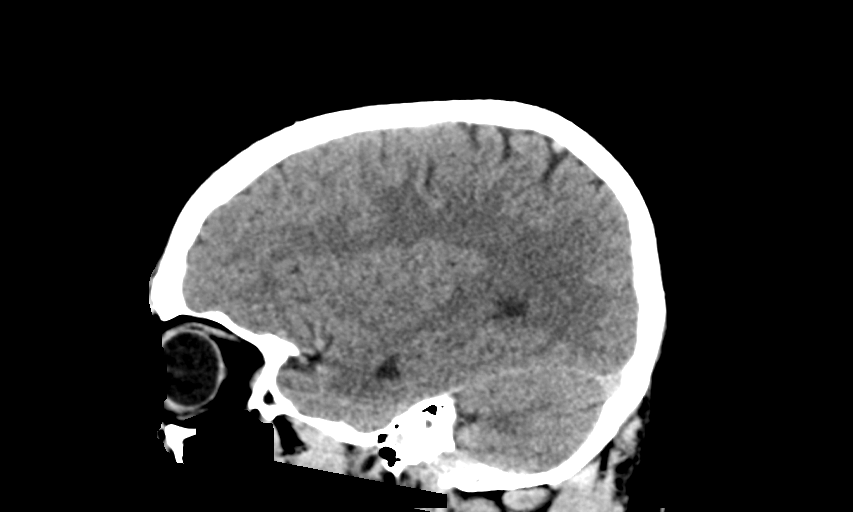
[im 34/67  brain]
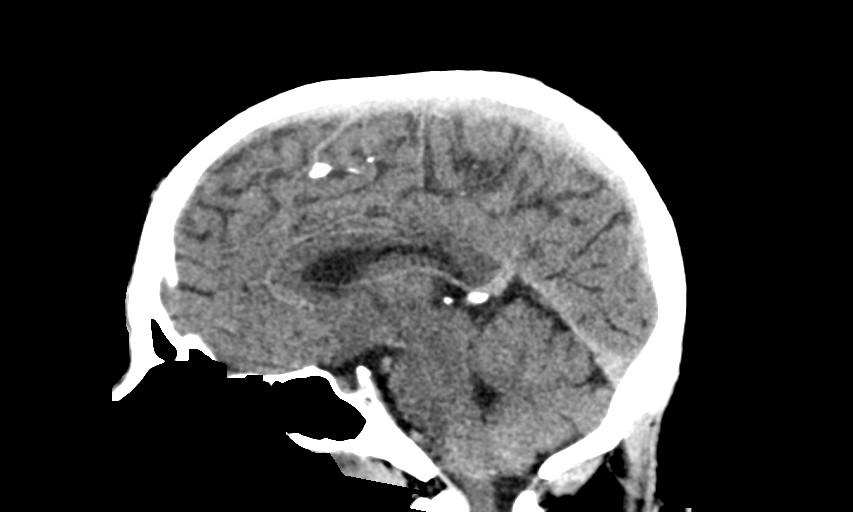
[im 45/67  brain]
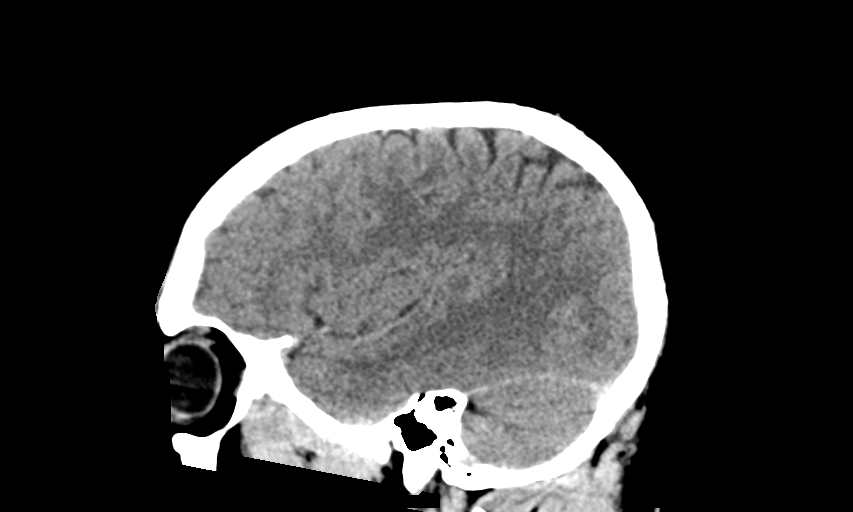

[14 of 47 positions shown; findings below may reference images not displayed]

FINDINGS: Brain:

No evidence of large-territorial acute infarction. No parenchymal
hemorrhage. No mass lesion. No extra-axial collection.

No mass effect or midline shift. No hydrocephalus. Basilar cisterns
are patent.

Vascular: No hyperdense vessel.

Skull: No acute fracture or focal lesion.

Sinuses/Orbits: Paranasal sinuses and mastoid air cells are clear.
The orbits are unremarkable.

Other: None.
IMPRESSION: No acute intracranial abnormality.

## 2022-08-31 ENCOUNTER — Emergency Department (HOSPITAL_COMMUNITY): Payer: BC Managed Care – PPO

## 2022-08-31 ENCOUNTER — Emergency Department (HOSPITAL_BASED_OUTPATIENT_CLINIC_OR_DEPARTMENT_OTHER): Payer: BC Managed Care – PPO

## 2022-08-31 ENCOUNTER — Emergency Department (HOSPITAL_COMMUNITY)
Admission: EM | Admit: 2022-08-31 | Discharge: 2022-08-31 | Disposition: A | Discharge status: Home / Self Care | Payer: BC Managed Care – PPO | Attending: Emergency Medicine | Admitting: Emergency Medicine

## 2022-08-31 DIAGNOSIS — M79661 Pain in right lower leg: Secondary | ICD-10-CM

## 2022-08-31 DIAGNOSIS — M79604 Pain in right leg: Secondary | ICD-10-CM | POA: Diagnosis present

## 2022-08-31 MED ORDER — NAPROXEN 500 MG PO TABS: 500.0000 mg | ORAL_TABLET | Freq: Two times a day (BID) | ORAL | 0 refills | Status: AC

## 2022-08-31 MED ORDER — METHOCARBAMOL 500 MG PO TABS: 500.0000 mg | ORAL_TABLET | Freq: Two times a day (BID) | ORAL | 0 refills | Status: AC

## 2022-08-31 MED ORDER — LIDOCAINE 4 % EX PTCH: 1.0000 | MEDICATED_PATCH | CUTANEOUS | 0 refills | Status: AC

## 2022-08-31 NOTE — Progress Notes (Signed)
Lower extremity venous right study completed.  Preliminary results relayed to [place], [place].   See CV Proc for preliminary results report.   Jean Rosenthal, RDMS, RVT

## 2022-08-31 NOTE — ED Triage Notes (Signed)
Pt to ED c/o right leg pain. Reports was running around with kids on Sunday and thinks he pulled hamstring. Ambulatory in triage.

## 2022-08-31 NOTE — Discharge Instructions (Addendum)
Your workup today was reassuring.  You probably have a pulled hamstring.  If you worsen muscle relaxers some pain patches.  Would recommend warm compress to the area.  If his symptoms do not improve over the next week would recommend following with orthopedics.  If you do not have 1 I have listed on your discharge paperwork.  You will need to call to schedule an appointment.

## 2022-08-31 NOTE — ED Provider Notes (Signed)
EMERGENCY DEPARTMENT AT [facility] Provider Note   CSN: [id number] Arrival date & time: 08/31/22  1524    History  Chief Complaint  Patient presents with   Leg Pain    Right     George Dickerson is a 42 y.o. male for evaluation of posterior right leg pain.  Was running with his kids on Sunday he thinks he pulled a muscle to his right hamstring.  He is ambulatory however hurts when it walks.  No numbness, weakness, redness, swelling, warmth.  No pain to back, abdomen.  States feels similar when he previously pulled his hamstring.  No history of PE or DVT.  No recent surgery, immobilization, malignancy.  No chest pain or shortness of breath.  Meds at home with only mild relief.  HPI     Home Medications Prior to Admission medications   Medication Sig Start Date End Date Taking? Authorizing Provider  lidocaine 4 % Place 1 patch onto the skin daily. 08/31/22  Yes Chariah Bailey A, PA-C  methocarbamol (ROBAXIN) 500 MG tablet Take 1 tablet (500 mg total) by mouth 2 (two) times daily. 08/31/22  Yes Marylouise Mallet A, PA-C  naproxen (NAPROSYN) 500 MG tablet Take 1 tablet (500 mg total) by mouth 2 (two) times daily. 08/31/22  Yes Kyrsten Deleeuw A, PA-C  benzonatate (TESSALON) 100 MG capsule Take 1-2 capsules (100-200 mg total) by mouth 3 (three) times daily as needed. 03/31/20   Wallis Bamberg, PA-C  cetirizine (ZYRTEC ALLERGY) 10 MG tablet Take 1 tablet (10 mg total) by mouth daily. 03/31/20   Wallis Bamberg, PA-C  ibuprofen (ADVIL) 600 MG tablet Take 1 tablet (600 mg total) by mouth every 6 (six) hours as needed. 05/02/20   Lamptey, Britta Mccreedy, MD  tiZANidine (ZANAFLEX) 4 MG tablet Take 1 tablet (4 mg total) by mouth at bedtime as needed for muscle spasms. 05/02/20   Lamptey, Britta Mccreedy, MD      Allergies    Patient has no known allergies.    Review of Systems   Review of Systems  Constitutional: Negative.   HENT: Negative.    Respiratory: Negative.    Cardiovascular: Negative.    Gastrointestinal: Negative.   Genitourinary: Negative.   Musculoskeletal:        Right posterior thigh pain  Skin: Negative.   Neurological: Negative.   All other systems reviewed and are negative.   Physical Exam Updated Vital Signs BP (!) 167/117 (BP Location: Right Arm)   Pulse 80   Temp 98.9 F (37.2 C) (Oral)   Resp 17   Ht 5\' 7"  (1.702 m)   Wt 81.6 kg   SpO2 99%   BMI 28.19 kg/m  Physical Exam Vitals and nursing note reviewed.  Constitutional:      General: He is not in acute distress.    Appearance: He is well-developed. He is not ill-appearing, toxic-appearing or diaphoretic.  HENT:     Head: Normocephalic and atraumatic.     Nose: Nose normal.     Mouth/Throat:     Mouth: Mucous membranes are moist.  Eyes:     Pupils: Pupils are equal, round, and reactive to light.  Cardiovascular:     Rate and Rhythm: Normal rate and regular rhythm.     Pulses: Normal pulses.          Dorsalis pedis pulses are 2+ on the right side and 2+ on the left side.       Posterior tibial pulses  are 2+ on the right side and 2+ on the left side.     Heart sounds: Normal heart sounds.  Pulmonary:     Effort: Pulmonary effort is normal. No respiratory distress.     Breath sounds: Normal breath sounds.  Abdominal:     General: Bowel sounds are normal. There is no distension.     Palpations: Abdomen is soft.  Musculoskeletal:        General: Normal range of motion.     Cervical back: Normal range of motion and neck supple.     Comments: Midline C/T/L tenderness.  Able to lift bilateral legs off bed.  Able to straight leg.  Able to flex and extend at bilateral lower extremities.  Tenderness palpation to right posterior hamstring.  Skin:    General: Skin is warm and dry.     Capillary Refill: Capillary refill takes less than 2 seconds.     Comments: No fluctuance, induration, erythema, warmth, swelling, ecchymosis.  Neurological:     General: No focal deficit present.     Mental  Status: He is alert and oriented to person, place, and time.     Sensory: Sensation is intact.     Motor: Motor function is intact.     Gait: Gait is intact.     Comments: intact sensation, ambulatory, equal strength     ED Results / Procedures / Treatments   Labs (all labs ordered are listed, but only abnormal results are displayed) Labs Reviewed - No data to display  EKG None  Radiology VAS [place] LOWER EXTREMITY VENOUS (DVT) (ONLY [facility])  Result Date: 08/31/2022  Lower Venous DVT Study Patient Name:  George Dickerson  Date of Exam:   08/31/2022 Medical Rec #: [id number]        Accession #:    [id number] Date of Birth: 09-Apr-1980        Patient Gender: M Patient Age:   18 years Exam Location:  [facility] Procedure:      VAS [place] LOWER EXTREMITY VENOUS (DVT) Referring Phys: Jolette Lana --------------------------------------------------------------------------------  Indications: Right posterior thigh pain- patient concerned for pulled muscle.  Comparison Study: No prior studies. Performing Technologist: Jean Rosenthal RDMS, RVT  Examination Guidelines: A complete evaluation includes B-mode imaging, spectral Doppler, color Doppler, and power Doppler as needed of all accessible portions of each vessel. Bilateral testing is considered an integral part of a complete examination. Limited examinations for reoccurring indications may be performed as noted. The reflux portion of the exam is performed with the patient in reverse Trendelenburg.  +---------+---------------+---------+-----------+----------+--------------+ RIGHT    CompressibilityPhasicitySpontaneityPropertiesThrombus Aging +---------+---------------+---------+-----------+----------+--------------+ CFV      Full           Yes      Yes                                 +---------+---------------+---------+-----------+----------+--------------+ SFJ      Full                                                         +---------+---------------+---------+-----------+----------+--------------+ FV Prox  Full                                                        +---------+---------------+---------+-----------+----------+--------------+  FV Mid   Full                                                        +---------+---------------+---------+-----------+----------+--------------+ FV DistalFull                                                        +---------+---------------+---------+-----------+----------+--------------+ PFV      Full                                                        +---------+---------------+---------+-----------+----------+--------------+ POP      Full           Yes      Yes                                 +---------+---------------+---------+-----------+----------+--------------+ PTV      Full                                                        +---------+---------------+---------+-----------+----------+--------------+ PERO     Full                                                        +---------+---------------+---------+-----------+----------+--------------+ Gastroc  Full                                                        +---------+---------------+---------+-----------+----------+--------------+   +----+---------------+---------+-----------+----------+--------------+ LEFTCompressibilityPhasicitySpontaneityPropertiesThrombus Aging +----+---------------+---------+-----------+----------+--------------+ CFV Full           Yes      Yes                                 +----+---------------+---------+-----------+----------+--------------+     Summary: RIGHT: - There is no evidence of deep vein thrombosis in the lower extremity.  - No cystic structure found in the popliteal fossa.  LEFT: - No evidence of common femoral vein obstruction.  *See table(s) above for measurements and observations. Electronically signed by Waverly Ferrari MD on 08/31/2022 at 6:07:34 PM.    Final    DG Femur Min 2 Views Right  Result Date: 08/31/2022 CLINICAL DATA:  Right leg pain.  Injury EXAM: RIGHT FEMUR 2 VIEWS COMPARISON:  None Available. FINDINGS: There is no evidence of fracture or other focal bone lesions. Soft tissues are unremarkable. IMPRESSION: No acute osseous abnormality Electronically Signed   By:  Karen Kays M.D.   On: 08/31/2022 17:31    Procedures Procedures    Medications Ordered in ED Medications - No data to display  ED Course/ Medical Decision Making/ A&P    42 year old here for evaluation of right posterior leg pain.  Began after running with kids on Sunday.  He is ambulatory.  Pain over his right hamstring with deep palpation.  He is neurovascularly intact.  No edema, erythema or warmth.  No overlying skin changes to suggest infectious process, obvious traumatic injury.  No midline back pain to suggest radiculopathy.  No symptoms to suggest cauda equina, discitis, osteomyelitis.  He has equal pulses bilaterally.  Symptoms do not seem consistent with claudication as he has great pulses distally.  His compartments are soft.  Low suspicion for compartment syndrome.  No obvious edema, erythema to extremities, low suspicion for VTE however will obtain ultrasound.  I suspect he likely has hamstring injury  Imaging personally viewed and interpreted  US Negative for VTE X-ray negative for fracture  Discussed results with patient.  He is ambulatory.  Possibly has hamstring strain.  Will treat symptomatically.  Given his great range of motion low suspicion for complete rupture however I did discuss follow-up with orthopedics if his symptoms do not improve over the next few days.  Discussed stretching exercises.  He will return for new or worsening symptoms.  At this time low suspicion for fracture, dislocation, vascular injury, hematoma, septic joint, radiculopathy, dissection compartment syndrome.  The patient has been  appropriately medically screened and/or stabilized in the ED. I have low suspicion for any other emergent medical condition which would require further screening, evaluation or treatment in the ED or require inpatient management.  Patient is hemodynamically stable and in no acute distress.  Patient able to ambulate in department prior to ED.  Evaluation does not show acute pathology that would require ongoing or additional emergent interventions while in the emergency department or further inpatient treatment.  I have discussed the diagnosis with the patient and answered all questions.  Pain is been managed while in the emergency department and patient has no further complaints prior to discharge.  Patient is comfortable with plan discussed in room and is stable for discharge at this time.  I have discussed strict return precautions for returning to the emergency department.  Patient was encouraged to follow-up with PCP/specialist refer to at discharge.                             Medical Decision Making Amount and/or Complexity of Data Reviewed External Data Reviewed: labs, radiology and notes. Radiology: ordered and independent interpretation performed. Decision-making details documented in ED Course.  Risk OTC drugs. Prescription drug management. Diagnosis or treatment significantly limited by social determinants of health.           Final Clinical Impression(s) / ED Diagnoses Final diagnoses:  Right leg pain    Rx / DC Orders ED Discharge Orders          Ordered    methocarbamol (ROBAXIN) 500 MG tablet  2 times daily        08/31/22 1751    lidocaine 4 %  Every 24 hours        08/31/22 1751    naproxen (NAPROSYN) 500 MG tablet  2 times daily        07 /02/24 1751  Vashaun Osmon A, PA-C 08/31/22 1819    Lonell Grandchild, MD 09/02/22 1030
